# Patient Record
Sex: Female | Born: 1995 | Race: Black or African American | Hispanic: No | Marital: Single | Smoking: Never smoker
Health system: Southern US, Community
[De-identification: ages and names within clinical notes are randomized; demographics above are authoritative.]

## PROBLEM LIST (undated history)

## (undated) DIAGNOSIS — J45909 Unspecified asthma, uncomplicated: Secondary | ICD-10-CM

## (undated) HISTORY — DX: Unspecified asthma, uncomplicated: J45.909

---
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
## 2017-05-14 IMAGING — MR MR FOOT*L* W/O CM
4 of 6 series · 21 of 40 positions shown · non-contrast
Comparison: Plain films left foot 05/28/2016.

CLINICAL DATA: Lateral left foot pain for 3 months. No known
injury.

EXAM:
MRI OF THE LEFT FOOT WITHOUT CONTRAST
TECHNIQUE: Multiplanar, multisequence MR imaging of the left foot was
performed. No intravenous contrast was administered.

[Series 3: T2 fat-sat · coronal · 4.0mm · 0.23mm/px · 8 of 30 slices shown (1 of 3)]
[im 1/30]
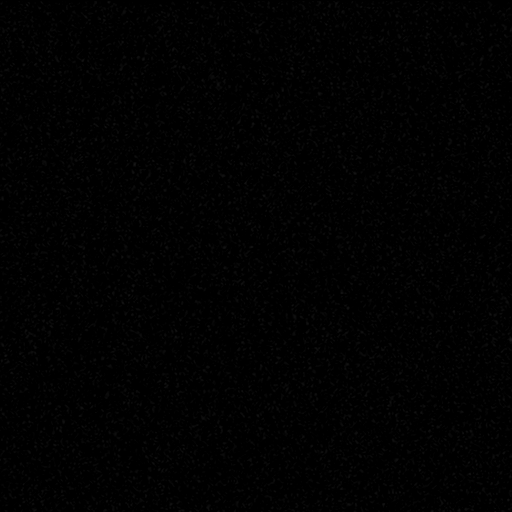
[im 5/30]
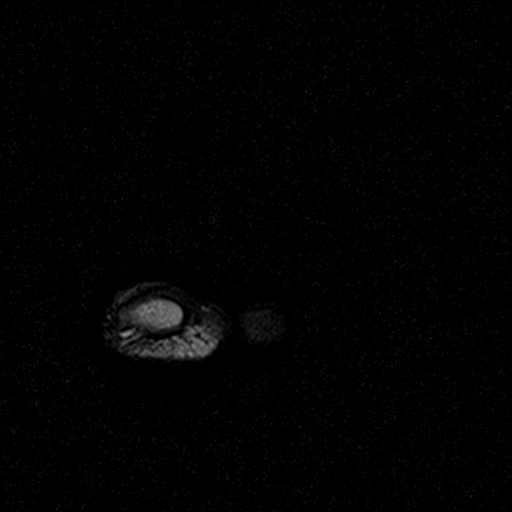
[im 9/30]
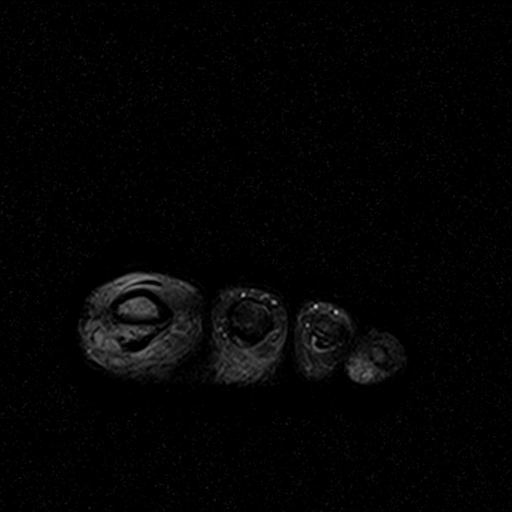
[im 13/30]
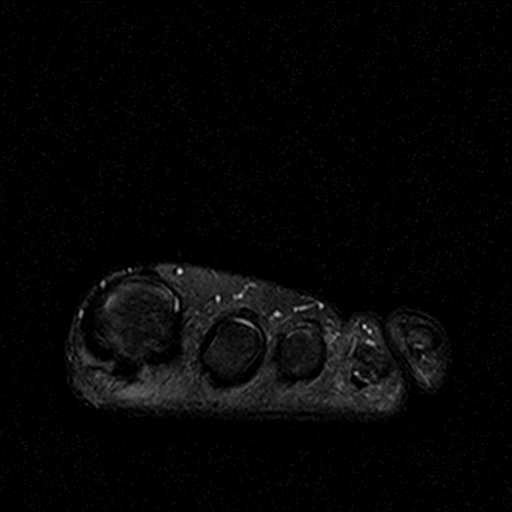
[im 17/30]
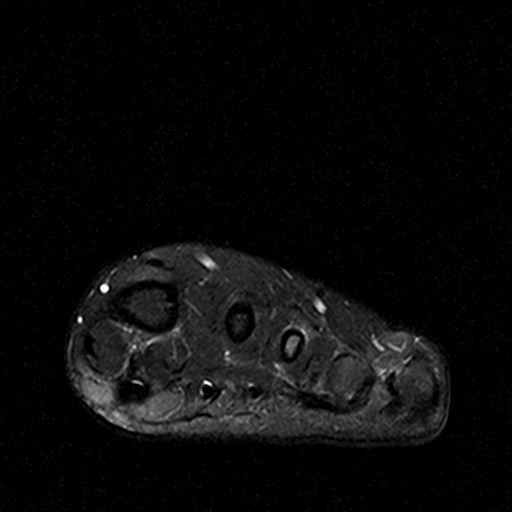
[im 21/30]
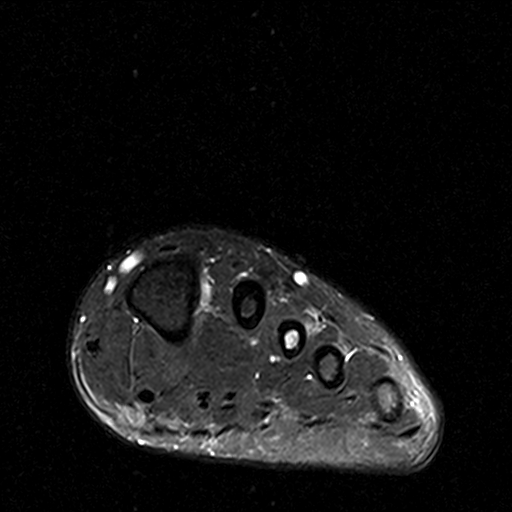
[im 25/30]
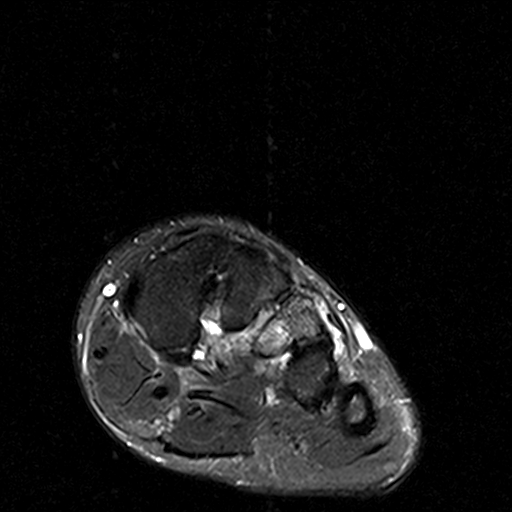
[im 30/30]
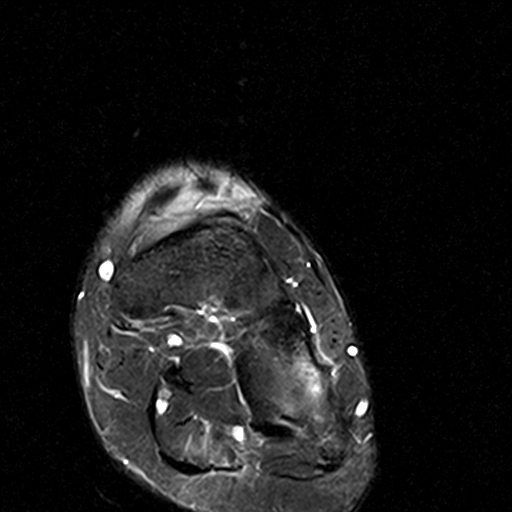

[Series 4: T1 · coronal · 4.0mm · 0.38mm/px · 3 of 30 slices shown]
[im 5/30]
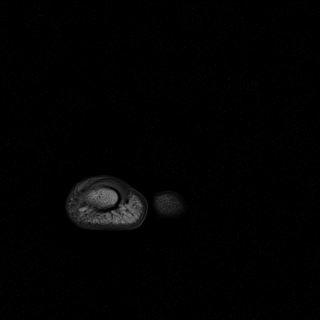
[im 17/30]
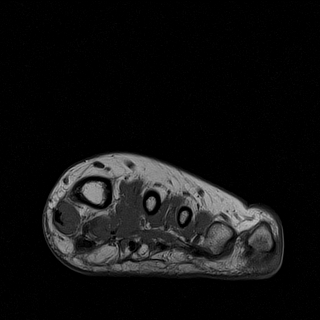
[im 25/30]
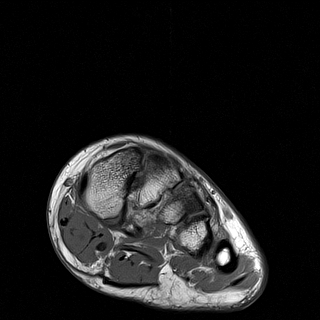

[Series 6: T2 fat-sat · axial · 2.5mm · 0.37mm/px · z∈[-102,-53]mm · 6 of 21 slices shown (2 of 3)]
[im 1/21]
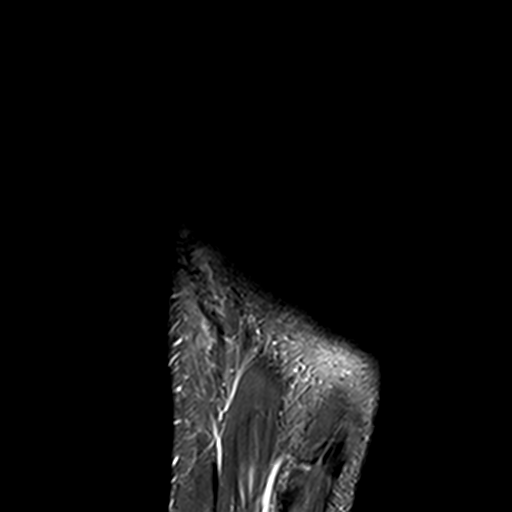
[im 5/21]
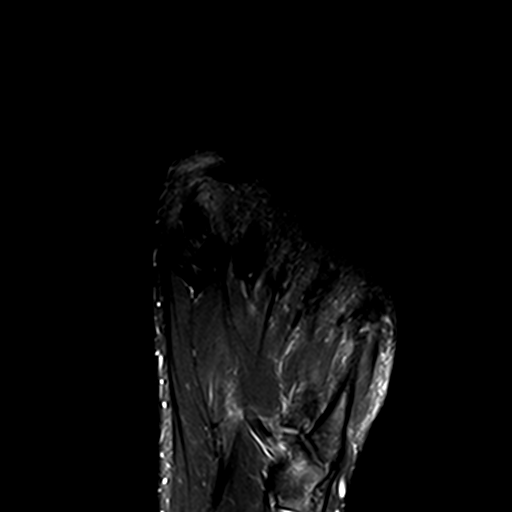
[im 9/21]
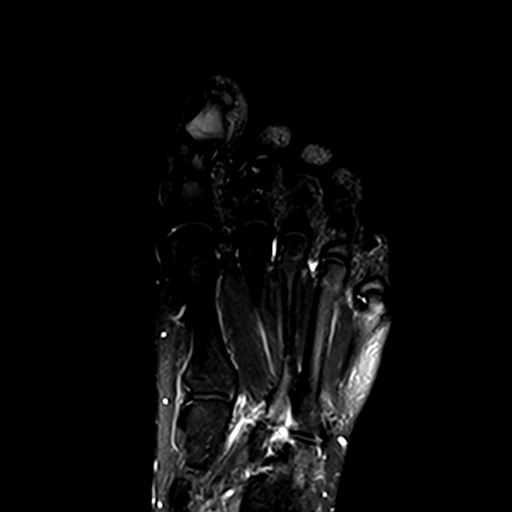
[im 13/21]
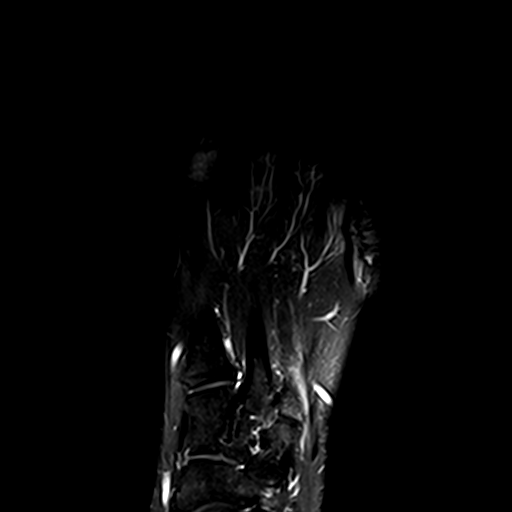
[im 17/21]
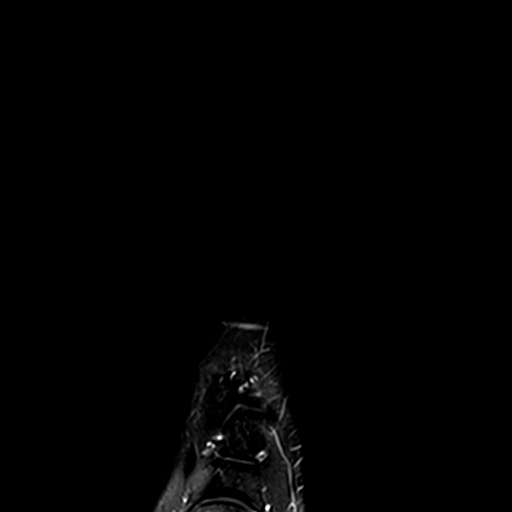
[im 21/21]
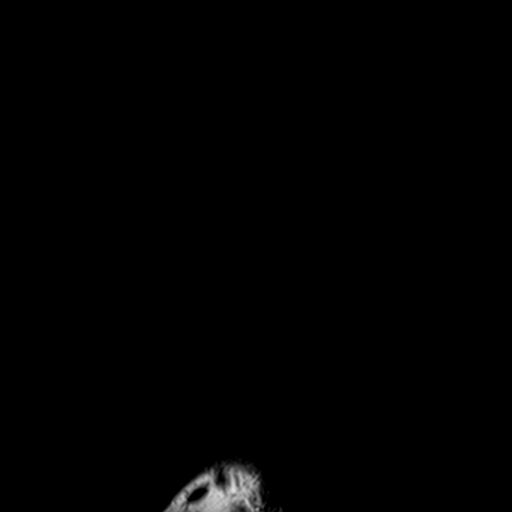

[Series 7: T2 fat-sat · sagittal · 3.0mm · 0.35mm/px · 4 of 25 slices shown (3 of 3)]
[im 1/25]
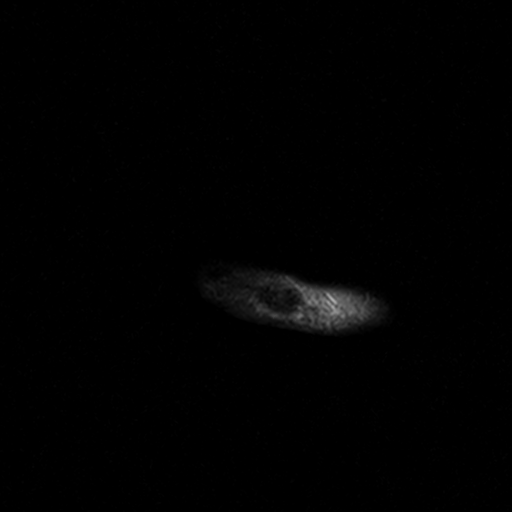
[im 5/25]
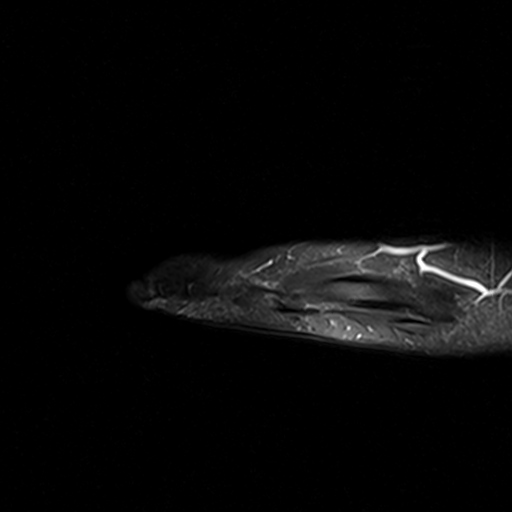
[im 13/25]
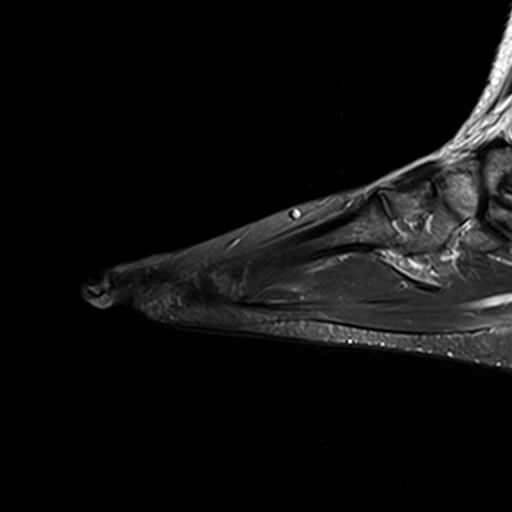
[im 21/25]
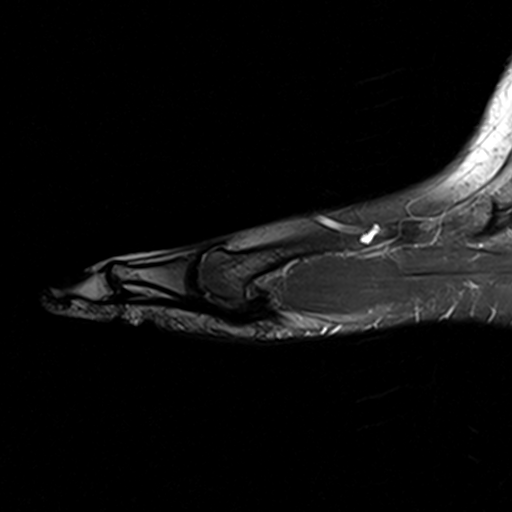

[21 of 40 positions shown; findings below may reference images not displayed]

FINDINGS: Bones/Joint/Cartilage

Intense marrow edema is seen in the base and throughout the
diaphysis of the third metatarsal. Milder degree of marrow edema is
seen throughout the fourth and fifth metatarsals and cuboid bone.
Rounded focus of decreased T1 and T2 signal in the lateral margin of
the base of the third metatarsal is consistent with sclerosis
possibly related to old injury. No fracture is identified.

Ligaments

Intact.

Muscles and Tendons

Intact and normal in appearance.

Soft tissues

Negative.
IMPRESSION: Findings consistent with stress reaction without fracture in the
cuboid and third through fifth metatarsals. Changes are worst and
third and fifth metatarsals. No fracture.

## 2018-03-17 ENCOUNTER — Ambulatory Visit: Payer: Self-pay | Admitting: Sports Medicine

## 2019-10-01 ENCOUNTER — Ambulatory Visit: Payer: 59 | Attending: Internal Medicine

## 2019-10-01 DIAGNOSIS — Z23 Encounter for immunization: Secondary | ICD-10-CM

## 2019-10-01 NOTE — Progress Notes (Signed)
   Covid-19 Vaccination Clinic  Name:  Brooke Parks    MRN: [id number] DOB: 24-Oct-1995  10/01/2019  [name] was observed post Covid-19 immunization for 15 minutes without incident. She was provided with Vaccine Information Sheet and instruction to access the V-Safe system.   [name] was instructed to call 911 with any severe reactions post vaccine: Marland Kitchen Difficulty breathing  . Swelling of face and throat  . A fast heartbeat  . A bad rash all over body  . Dizziness and weakness   Immunizations Administered    Name Date Dose VIS Date Route   Pfizer COVID-19 Vaccine 10/01/2019  1:04 PM 0.3 mL 05/29/2019 Intramuscular   Manufacturer: [employer], [employer]   Lot: [id number]   NDC: [address]-1

## 2019-10-26 ENCOUNTER — Ambulatory Visit: Payer: 59 | Attending: Internal Medicine

## 2019-10-26 DIAGNOSIS — Z23 Encounter for immunization: Secondary | ICD-10-CM

## 2019-10-26 NOTE — Progress Notes (Signed)
   Covid-19 Vaccination Clinic  Name:  Brooke Parks    MRN: [id number] DOB: Jun 09, 1996  10/26/2019  Brooke Parks was observed post Covid-19 immunization for 15 minutes without incident. She was provided with Vaccine Information Sheet and instruction to access the V-Safe system.   Brooke Parks was instructed to call 911 with any severe reactions post vaccine: Marland Kitchen Difficulty breathing  . Swelling of face and throat  . A fast heartbeat  . A bad rash all over body  . Dizziness and weakness   Immunizations Administered    Name Date Dose VIS Date Route   Pfizer COVID-19 Vaccine 10/26/2019 10:42 AM 0.3 mL 08/12/2018 Intramuscular   Manufacturer: [employer], [employer]   Lot: [id number]   NDC: [address]-8

## 2022-10-29 ENCOUNTER — Ambulatory Visit (INDEPENDENT_AMBULATORY_CARE_PROVIDER_SITE_OTHER): Payer: Managed Care, Other (non HMO) | Admitting: Family Medicine

## 2022-10-29 ENCOUNTER — Encounter: Payer: Self-pay | Admitting: Family Medicine

## 2022-10-29 VITALS — BP 129/81 | HR 71 | Temp 98.2°F | Resp 20 | Ht 68.0 in | Wt 247.0 lb

## 2022-10-29 DIAGNOSIS — Z7689 Persons encountering health services in other specified circumstances: Secondary | ICD-10-CM

## 2022-10-29 DIAGNOSIS — Z124 Encounter for screening for malignant neoplasm of cervix: Secondary | ICD-10-CM

## 2022-10-29 DIAGNOSIS — Z0000 Encounter for general adult medical examination without abnormal findings: Secondary | ICD-10-CM | POA: Diagnosis not present

## 2022-10-29 DIAGNOSIS — N898 Other specified noninflammatory disorders of vagina: Secondary | ICD-10-CM

## 2022-10-29 DIAGNOSIS — R7302 Impaired glucose tolerance (oral): Secondary | ICD-10-CM

## 2022-10-29 DIAGNOSIS — Z1159 Encounter for screening for other viral diseases: Secondary | ICD-10-CM

## 2022-10-29 DIAGNOSIS — Z13220 Encounter for screening for lipoid disorders: Secondary | ICD-10-CM

## 2022-10-29 NOTE — Progress Notes (Signed)
Complete physical exam and establish care  Patient: Brooke Parks   DOB: 11-May-1996   27 y.o. Female  MRN: [id number]  Subjective:    Chief Complaint  Patient presents with   Establish Care   Annual Exam    Brooke Parks is a 27 y.o. female who presents today for a complete physical exam. She reports consuming a general diet. The patient does not participate in regular exercise at present. She generally feels well. She reports sleeping well. She does have additional problems to discuss today.  She reports vaginal itching before her periods. No discharge. Haven't been sexually active since January 2024. Periods are regular, LMP 10/21/22.  Most recent fall risk assessment:    10/29/2022   11:16 AM  Fall Risk   Falls in the past year? 0  Number falls in past yr: 0  Injury with Fall? 0  Risk for fall due to : No Fall Risks  Follow up Falls evaluation completed     Most recent depression screenings:    10/29/2022   11:16 AM 07/16/2016    9:48 AM  PHQ 2/9 Scores  PHQ - 2 Score 1 0  PHQ- 9 Score 4   Exception Documentation  Other- indicate reason in comment box    Vision:Within last year  There are no problems to display for this patient.  Past Medical History:  Diagnosis Date   Asthma    No past surgical history on file. Social History   Tobacco Use   Smoking status: Never    Passive exposure: Never   Smokeless tobacco: Never  Vaping Use   Vaping Use: Never used  Substance Use Topics   Alcohol use: Never   Drug use: Never   Social History   Socioeconomic History   Marital status: Single    Spouse name: Not on file   Number of children: Not on file   Years of education: Not on file   Highest education level: Not on file  Occupational History   Not on file  Tobacco Use   Smoking status: Never    Passive exposure: Never   Smokeless tobacco: Never  Vaping Use   Vaping Use: Never used  Substance and Sexual Activity   Alcohol use: Never   Drug use: Never    Sexual activity: Yes    Birth control/protection: None  Other Topics Concern   Not on file  Social History Narrative   Not on file   Social Determinants of Health   Financial Resource Strain: Not on file  Food Insecurity: Not on file  Transportation Needs: Not on file  Physical Activity: Not on file  Stress: Not on file  Social Connections: Not on file  Intimate Partner Violence: Not on file   Family Status  Relation Name Status   Mother  Alive   Father  Deceased   Allergies  Allergen Reactions   Gramineae Pollens Hives   Other Hives      Patient Care Team: Suzan Slick, MD as PCP - General (Family Medicine)   Outpatient Medications Prior to Visit  Medication Sig Note   PROAIR HFA 108 (90 Base) MCG/ACT inhaler  (Patient not taking: Reported on 10/29/2022) 10/29/2022: Patient states that she does have inhaler that she uses      [DISCONTINUED] ranitidine (ZANTAC) 150 MG tablet Take 1 tablet (150 mg total) by mouth 2 (two) times daily.    No facility-administered medications prior to visit.    Review of  Systems  Genitourinary:        Vaginal itching before periods  All other systems reviewed and are negative.        Objective:     BP 129/81   Pulse 71   Temp 98.2 F (36.8 C) (Oral)   Resp 20   Ht 5\' 8"  (1.727 m)   Wt 247 lb (112 kg)   LMP 10/20/2022 (Exact Date)   SpO2 100%   BMI 37.56 kg/m  BP Readings from Last 3 Encounters:  10/29/22 129/81  07/16/16 (!) 112/55  06/27/16 120/70      Physical Exam Vitals and nursing note reviewed. Exam conducted with a chaperone present.  Constitutional:      Appearance: Normal appearance.     Comments: overweight  HENT:     Head: Normocephalic and atraumatic.     Right Ear: Tympanic membrane, ear canal and external ear normal.     Left Ear: Tympanic membrane, ear canal and external ear normal.     Nose: Nose normal.     Mouth/Throat:     Mouth: Mucous membranes are moist.     Pharynx:  Oropharynx is clear.  Eyes:     Extraocular Movements: Extraocular movements intact.     Conjunctiva/sclera: Conjunctivae normal.     Pupils: Pupils are equal, round, and reactive to light.  Cardiovascular:     Rate and Rhythm: Normal rate and regular rhythm.     Pulses: Normal pulses.     Heart sounds: Normal heart sounds.  Pulmonary:     Effort: Pulmonary effort is normal.     Breath sounds: Normal breath sounds.  Abdominal:     General: Abdomen is flat. Bowel sounds are normal.  Genitourinary:    General: Normal vulva.  Musculoskeletal:        General: Normal range of motion.  Skin:    General: Skin is warm.     Capillary Refill: Capillary refill takes less than 2 seconds.  Neurological:     General: No focal deficit present.     Mental Status: She is alert and oriented to person, place, and time. Mental status is at baseline.  Psychiatric:        Mood and Affect: Mood normal.        Behavior: Behavior normal.        Thought Content: Thought content normal.        Judgment: Judgment normal.     No results found for any visits on 10/29/22.      Assessment & Plan:    Routine Health Maintenance and Physical Exam  Immunization History  Administered Date(s) Administered   PFIZER(Purple Top)SARS-COV-2 Vaccination 10/01/2019, 10/26/2019   PPD Test 12/12/2020    Health Maintenance  Topic Date Due   HPV VACCINES (1 - 2-dose series) Never done   HIV Screening  Never done   Hepatitis C Screening  Never done   DTaP/Tdap/Td (1 - Tdap) Never done   PAP-Cervical Cytology Screening  Never done   PAP SMEAR-Modifier  Never done   COVID-19 Vaccine (3 - 2023-24 season) 02/16/2022   INFLUENZA VACCINE  01/17/2023    Discussed health benefits of physical activity, and encouraged her to engage in regular exercise appropriate for her age and condition.  No follow-ups on file. Annual physical exam  Encounter to establish care with new doctor  Impaired glucose tolerance -      CBC with Differential/Platelet -     Comprehensive metabolic panel -  Hemoglobin A1c  Encounter for lipid screening for cardiovascular disease -     Lipid panel  Screening for cervical cancer -     Pap IG and Chlamydia/Gonococcus, NAA  Screening for viral disease -     Hepatitis C antibody -     HIV Antibody (routine testing w rflx) -     Pap IG and Chlamydia/Gonococcus, NAA  Vaginal itching -     Urinalysis   Screening labs with std screening on pap swab Urinalysis due to vaginal itching. Follow up pending results or worsening symptoms, which tends to be only before periods.      Suzan Slick, MD

## 2022-10-30 LAB — LIPID PANEL
Chol/HDL Ratio: 4.4 ratio (ref 0.0–4.4)
Cholesterol, Total: 180 mg/dL (ref 100–199)
HDL: 41 mg/dL (ref >39)
LDL Chol Calc (NIH): 123 mg/dL — ABNORMAL HIGH (ref 0–99)
Triglycerides: 84 mg/dL (ref 0–149)
VLDL Cholesterol Cal: 16 mg/dL (ref 5–40)

## 2022-10-30 LAB — CBC WITH DIFFERENTIAL/PLATELET
Basophils Absolute: 0 10*3/uL (ref 0.0–0.2)
Basos: 1 %
EOS (ABSOLUTE): 0.2 10*3/uL (ref 0.0–0.4)
Eos: 3 %
Hematocrit: 39.4 % (ref 34.0–46.6)
Hemoglobin: 11.9 g/dL (ref 11.1–15.9)
Immature Grans (Abs): 0 10*3/uL (ref 0.0–0.1)
Immature Granulocytes: 0 %
Lymphocytes Absolute: 1.8 10*3/uL (ref 0.7–3.1)
Lymphs: 36 %
MCH: 23.2 pg — ABNORMAL LOW (ref 26.6–33.0)
MCHC: 30.2 g/dL — ABNORMAL LOW (ref 31.5–35.7)
MCV: 77 fL — ABNORMAL LOW (ref 79–97)
Monocytes Absolute: 0.4 10*3/uL (ref 0.1–0.9)
Monocytes: 8 %
Neutrophils Absolute: 2.6 10*3/uL (ref 1.4–7.0)
Neutrophils: 52 %
Platelets: 399 10*3/uL (ref 150–450)
RBC: 5.13 x10E6/uL (ref 3.77–5.28)
RDW: 14.5 % (ref 11.7–15.4)
WBC: 4.9 10*3/uL (ref 3.4–10.8)

## 2022-10-30 LAB — COMPREHENSIVE METABOLIC PANEL
ALT: 12 IU/L (ref 0–32)
AST: 13 IU/L (ref 0–40)
Albumin/Globulin Ratio: 1.5 (ref 1.2–2.2)
Albumin: 4.2 g/dL (ref 4.0–5.0)
Alkaline Phosphatase: 61 IU/L (ref 44–121)
BUN/Creatinine Ratio: 9 (ref 9–23)
BUN: 7 mg/dL (ref 6–20)
Bilirubin Total: <0.2 mg/dL (ref 0.0–1.2)
CO2: 19 mmol/L — ABNORMAL LOW (ref 20–29)
Calcium: 9.4 mg/dL (ref 8.7–10.2)
Chloride: 106 mmol/L (ref 96–106)
Creatinine, Ser: 0.76 mg/dL (ref 0.57–1.00)
Globulin, Total: 2.8 g/dL (ref 1.5–4.5)
Glucose: 88 mg/dL (ref 70–99)
Potassium: 4.2 mmol/L (ref 3.5–5.2)
Sodium: 139 mmol/L (ref 134–144)
Total Protein: 7 g/dL (ref 6.0–8.5)
eGFR: 111 mL/min/{1.73_m2} (ref >59)

## 2022-10-30 LAB — URINALYSIS
Bilirubin, UA: NEGATIVE
Glucose, UA: NEGATIVE
Ketones, UA: NEGATIVE
Leukocytes,UA: NEGATIVE
Nitrite, UA: NEGATIVE
Protein,UA: NEGATIVE
RBC, UA: NEGATIVE
Specific Gravity, UA: 1.025 (ref 1.005–1.030)
Urobilinogen, Ur: 0.2 mg/dL (ref 0.2–1.0)
pH, UA: 5.5 (ref 5.0–7.5)

## 2022-10-30 LAB — HIV ANTIBODY (ROUTINE TESTING W REFLEX): HIV Screen 4th Generation wRfx: NONREACTIVE

## 2022-10-30 LAB — HEPATITIS C ANTIBODY: Hep C Virus Ab: NONREACTIVE

## 2022-10-30 LAB — HEMOGLOBIN A1C
Est. average glucose Bld gHb Est-mCnc: 103 mg/dL
Hgb A1c MFr Bld: 5.2 % (ref 4.8–5.6)

## 2022-10-31 LAB — PAP IG AND CT-NG NAA
Chlamydia, Nuc. Acid Amp: NEGATIVE
Gonococcus by Nucleic Acid Amp: NEGATIVE
PAP Smear Comment: 0

## 2022-11-26 ENCOUNTER — Telehealth: Payer: Self-pay | Admitting: Family Medicine

## 2022-11-26 NOTE — Telephone Encounter (Signed)
error 

## 2023-01-24 ENCOUNTER — Encounter (INDEPENDENT_AMBULATORY_CARE_PROVIDER_SITE_OTHER): Payer: Managed Care, Other (non HMO) | Admitting: Family Medicine

## 2023-01-24 ENCOUNTER — Encounter: Payer: Self-pay | Admitting: Family Medicine

## 2023-01-24 ENCOUNTER — Ambulatory Visit: Payer: Managed Care, Other (non HMO) | Admitting: Family Medicine

## 2023-01-24 VITALS — BP 120/78 | HR 84 | Temp 98.2°F | Resp 18 | Ht 68.0 in | Wt 246.2 lb

## 2023-01-24 DIAGNOSIS — F418 Other specified anxiety disorders: Secondary | ICD-10-CM

## 2023-01-24 DIAGNOSIS — S39012A Strain of muscle, fascia and tendon of lower back, initial encounter: Secondary | ICD-10-CM

## 2023-01-24 MED ORDER — HYDROXYZINE HCL 10 MG PO TABS: 10.0000 mg | ORAL_TABLET | Freq: Three times a day (TID) | ORAL | 0 refills | Status: DC | PRN

## 2023-01-24 MED ORDER — METHYLPREDNISOLONE 4 MG PO TBPK: ORAL_TABLET | ORAL | 0 refills | Status: DC

## 2023-01-24 MED ORDER — BACLOFEN 10 MG PO TABS: 10.0000 mg | ORAL_TABLET | Freq: Three times a day (TID) | ORAL | 0 refills | Status: DC

## 2023-01-24 MED ORDER — ESCITALOPRAM OXALATE 5 MG PO TABS: 5.0000 mg | ORAL_TABLET | Freq: Every day | ORAL | 3 refills | Status: DC

## 2023-01-24 NOTE — Progress Notes (Signed)
   Acute Office Visit  Subjective:     Patient ID: Brooke Parks, female    DOB: 12-13-1995, 27 y.o.   MRN: [id number]  Chief Complaint  Patient presents with   Back Pain    Patient is here to establish care,she states that Saturday while changing her tire on her car, she felt as though she may have pulled something in her lower back , she states today on a pain scale her pain is a (6) Saturday when it happened her pain scale was a (10)    HPI Patient is in today for acute visit.  She reports Saturday, she was changing a tire and had back pain that day. She reports the pain was 10/10 that day and got worse on Sunday.  She has tried Tylenol 500 mg every 6 hours x 2 days. This didn't help her pain. Bending over makes the pain worse. Sitting relieves the pain. She reports she hurt her back before some years ago. She went to ER at that time and was given steroids and muscle relaxer.   ROS      Objective:    BP 120/78   Pulse 84   Temp 98.2 F (36.8 C) (Oral)   Resp 18   Ht 5\' 8"  (1.727 m)   Wt 246 lb 3.2 oz (111.7 kg)   LMP 01/19/2023 (Exact Date)   SpO2 97%   BMI 37.43 kg/m    Physical Exam  No results found for any visits on 01/24/23.      Assessment & Plan:   Problem List Items Addressed This Visit   None   No orders of the defined types were placed in this encounter.   No follow-ups on file.  Suzan Slick, MD

## 2023-01-24 NOTE — Progress Notes (Signed)
Established Patient Office Visit  Subjective   Patient ID: Brooke Parks, female    DOB: 1996/05/04  Age: 27 y.o. MRN: [id number]  Chief Complaint  Patient presents with   Back Pain     Patient is here to establish care,she states that Saturday while changing her tire on her car, she felt as though she may have pulled something in her lower back , she states today on a pain scale her pain is a (6) Saturday when it happened her pain scale was a (10)      HPI  Back pain She reports Saturday, she was changing a tire and had back pain that day. She reports the pain was 10/10 that day and got worse on Sunday. She has tried Tylenol 500 mg every 6 hours x 2 days. This didn't help her pain. Bending over makes the pain worse. Sitting relieves the pain. She reports she hurt her back before some years ago. She went to ER at that time and was given steroids and muscle relaxer. She reports this helped.   Depression/anxiety Pt also reports worsening anxiety and depression. She was prescribed Lexapro 5 mg and Hydroxyzine 10 mg TID. She reports she never took the Lexapro but only using the Hydroxyzine and this wasn't helping. She reports her mood and anxiety has worsened. Flowsheet Row Office Visit from 01/24/2023 in [facility]  PHQ-9 Total Score 16          01/24/2023    8:44 AM 10/29/2022   11:17 AM  GAD 7 : Generalized Anxiety Score  Nervous, Anxious, on Edge 3 0  Control/stop worrying 2 0  Worry too much - different things 2 0  Trouble relaxing 2 0  Restless 3 0  Easily annoyed or irritable 3 1  Afraid - awful might happen 1 0  Total GAD 7 Score 16 1  Anxiety Difficulty Somewhat difficult Not difficult at all     Review of Systems  Musculoskeletal:  Positive for back pain.  Psychiatric/Behavioral:  Positive for depression. The patient is nervous/anxious.   All other systems reviewed and are negative.    Objective:     LMP 01/19/2023 (Exact Date)     Physical Exam Vitals and nursing note reviewed.  Constitutional:      Appearance: Normal appearance. She is normal weight.  HENT:     Head: Normocephalic and atraumatic.     Right Ear: External ear normal.     Left Ear: External ear normal.     Nose: Nose normal.     Mouth/Throat:     Mouth: Mucous membranes are moist.     Pharynx: Oropharynx is clear.  Eyes:     Conjunctiva/sclera: Conjunctivae normal.     Pupils: Pupils are equal, round, and reactive to light.  Cardiovascular:     Rate and Rhythm: Normal rate and regular rhythm.     Pulses: Normal pulses.     Heart sounds: Normal heart sounds.  Pulmonary:     Effort: Pulmonary effort is normal.     Breath sounds: Normal breath sounds.  Abdominal:     General: Abdomen is flat.  Musculoskeletal:        General: Swelling and tenderness present. Normal range of motion.     Comments: Left paraspinal tenderness  Skin:    General: Skin is warm.     Capillary Refill: Capillary refill takes less than 2 seconds.  Neurological:     General:  No focal deficit present.     Mental Status: She is alert and oriented to person, place, and time. Mental status is at baseline.  Psychiatric:        Mood and Affect: Mood normal.        Behavior: Behavior normal.        Thought Content: Thought content normal.        Judgment: Judgment normal.    No results found for any visits on 01/24/23.    The ASCVD Risk score (Arnett DK, et al., 2019) failed to calculate for the following reasons:   The 2019 ASCVD risk score is only valid for ages 22 to 16    Assessment & Plan:   Problem List Items Addressed This Visit   None Strain of lumbar region, initial encounter -     methylPREDNISolone; 6-day pack as directed  Dispense: 21 tablet; Refill: 0 -     Baclofen; Take 1 tablet (10 mg total) by mouth 3 (three) times daily.  Dispense: 30 each; Refill: 0  Depression with anxiety -     Escitalopram Oxalate; Take 1 tablet (5 mg total) by  mouth daily.  Dispense: 30 tablet; Refill: 3 -     hydrOXYzine HCl; Take 1 tablet (10 mg total) by mouth 3 (three) times daily as needed.  Dispense: 30 tablet; Refill: 0   Back pain likely strain. Medrol dose pack and Baclofen 10mg  TID prn sent. Also advised to do stretches and muscle rubs. For worsening depression/anxiety, resend Lexapro 5mg  daily and Hydroxyzine 10mg  TID prn. Follow up in 4-6 weeks.    No follow-ups on file.    Suzan Slick, MD

## 2023-02-08 ENCOUNTER — Other Ambulatory Visit: Payer: Self-pay | Admitting: Family Medicine

## 2023-02-08 DIAGNOSIS — F418 Other specified anxiety disorders: Secondary | ICD-10-CM

## 2023-02-12 ENCOUNTER — Other Ambulatory Visit: Payer: Self-pay | Admitting: Family Medicine

## 2023-02-12 DIAGNOSIS — F418 Other specified anxiety disorders: Secondary | ICD-10-CM

## 2023-02-12 MED ORDER — HYDROXYZINE HCL 10 MG PO TABS: 10.0000 mg | ORAL_TABLET | Freq: Three times a day (TID) | ORAL | 0 refills | Status: DC | PRN

## 2023-03-11 ENCOUNTER — Other Ambulatory Visit: Payer: Self-pay | Admitting: Family Medicine

## 2023-03-11 DIAGNOSIS — F418 Other specified anxiety disorders: Secondary | ICD-10-CM

## 2023-03-13 MED ORDER — HYDROXYZINE HCL 10 MG PO TABS: 10.0000 mg | ORAL_TABLET | Freq: Three times a day (TID) | ORAL | 5 refills | Status: AC | PRN

## 2023-05-07 ENCOUNTER — Ambulatory Visit (INDEPENDENT_AMBULATORY_CARE_PROVIDER_SITE_OTHER): Payer: Self-pay

## 2023-05-07 ENCOUNTER — Encounter: Payer: Self-pay | Admitting: Family Medicine

## 2023-05-07 ENCOUNTER — Ambulatory Visit (INDEPENDENT_AMBULATORY_CARE_PROVIDER_SITE_OTHER): Payer: Self-pay | Admitting: Family Medicine

## 2023-05-07 VITALS — BP 122/81 | HR 72 | Temp 98.1°F | Resp 18 | Ht 68.0 in | Wt 256.4 lb

## 2023-05-07 DIAGNOSIS — S39012D Strain of muscle, fascia and tendon of lower back, subsequent encounter: Secondary | ICD-10-CM

## 2023-05-07 MED ORDER — METHYLPREDNISOLONE 4 MG PO TBPK: ORAL_TABLET | ORAL | 0 refills | Status: DC

## 2023-05-07 MED ORDER — BACLOFEN 10 MG PO TABS: 10.0000 mg | ORAL_TABLET | Freq: Three times a day (TID) | ORAL | 0 refills | Status: DC

## 2023-05-07 NOTE — Progress Notes (Signed)
Acute Office Visit  Subjective:     Patient ID: Brooke Parks, female    DOB: 10-Jan-1996, 27 y.o.   MRN: [id number]  Chief Complaint  Patient presents with   Back Pain    Patient states that she has been having muscle spasms in her lower to mid  back since Saturday, she states that when she woke up to use the bathroom that's when the pain started making it hard for her to walk, she states at one point she fell to the floor and could not get up , pain was so intense it caused nausea and vomiting    Back Pain  Patient is in today for acute visit.  Back pain She reports left sided back pain. She got out of bed on Saturday morning to use the restroom. When she tried to get up off the toilet, she started having sharp back pain. The whole weekend, it was painful. When she moves certain ways, the pain is worse. She says it's usually a throb. She says Saturday was the worse with any movement. She says it was hard to get up out of bed on Sunday morning. She says she has been taking Baclofen that she had left from initial visit and used heating pads. This has helped some.   Review of Systems  Musculoskeletal:  Positive for back pain.  All other systems reviewed and are negative.       Objective:    BP 122/81   Pulse 72   Temp 98.1 F (36.7 C) (Oral)   Resp 18   Ht 5\' 8"  (1.727 m)   Wt 256 lb 6.4 oz (116.3 kg)   SpO2 98%   BMI 38.99 kg/m    Physical Exam Vitals and nursing note reviewed.  Constitutional:      Appearance: Normal appearance. She is normal weight.  HENT:     Head: Normocephalic and atraumatic.     Right Ear: External ear normal.     Left Ear: External ear normal.     Nose: Nose normal.     Mouth/Throat:     Mouth: Mucous membranes are moist.  Eyes:     Extraocular Movements: Extraocular movements intact.  Cardiovascular:     Rate and Rhythm: Normal rate.  Pulmonary:     Effort: Pulmonary effort is normal.  Musculoskeletal:     Comments: Diminished ROM  of L spine in all directions.   Skin:    General: Skin is warm.     Capillary Refill: Capillary refill takes less than 2 seconds.  Neurological:     General: No focal deficit present.     Mental Status: She is alert and oriented to person, place, and time. Mental status is at baseline.  Psychiatric:        Mood and Affect: Mood normal.        Behavior: Behavior normal.        Thought Content: Thought content normal.        Judgment: Judgment normal.    No results found for any visits on 05/07/23.      Assessment & Plan:   Problem List Items Addressed This Visit   None Visit Diagnoses     Strain of lumbar region, subsequent encounter    -  Primary   Relevant Medications   methylPREDNISolone (MEDROL DOSEPAK) 4 MG TBPK tablet   baclofen (LIORESAL) 10 MG tablet   Other Relevant Orders   DG Lumbar Spine Complete  Strain of lumbar region, subsequent encounter -     DG Lumbar Spine Complete; Future -     methylPREDNISolone; 6-day pack as directed  Dispense: 21 tablet; Refill: 0 -     Baclofen; Take 1 tablet (10 mg total) by mouth 3 (three) times daily.  Dispense: 30 each; Refill: 0   Pt with recurrent and exacerbation of back pain since being seen in August. She was given Medrol dose pack and baclofen which worked. Will refill this and give her back exercises to do at home. Will send for xrays to rule out spinal abnormalities. To follow up on xray via mychart.  Meds ordered this encounter  Medications   methylPREDNISolone (MEDROL DOSEPAK) 4 MG TBPK tablet    Sig: 6-day pack as directed    Dispense:  21 tablet    Refill:  0   baclofen (LIORESAL) 10 MG tablet    Sig: Take 1 tablet (10 mg total) by mouth 3 (three) times daily.    Dispense:  30 each    Refill:  0    No follow-ups on file.  Suzan Slick, MD

## 2023-10-30 ENCOUNTER — Encounter: Payer: Managed Care, Other (non HMO) | Admitting: Family Medicine

## 2023-11-10 ENCOUNTER — Other Ambulatory Visit: Payer: Self-pay | Admitting: Family Medicine

## 2023-11-10 DIAGNOSIS — F418 Other specified anxiety disorders: Secondary | ICD-10-CM

## 2023-11-26 ENCOUNTER — Encounter: Admitting: Family Medicine

## 2023-12-12 ENCOUNTER — Encounter: Payer: Self-pay | Admitting: Family Medicine

## 2023-12-12 ENCOUNTER — Other Ambulatory Visit: Payer: Self-pay | Admitting: Family Medicine

## 2023-12-12 ENCOUNTER — Other Ambulatory Visit: Payer: Self-pay

## 2023-12-12 DIAGNOSIS — F418 Other specified anxiety disorders: Secondary | ICD-10-CM

## 2023-12-12 MED ORDER — ESCITALOPRAM OXALATE 5 MG PO TABS: 5.0000 mg | ORAL_TABLET | Freq: Every day | ORAL | 0 refills | Status: AC

## 2024-01-20 ENCOUNTER — Encounter: Payer: Self-pay | Admitting: Family Medicine

## 2024-01-20 ENCOUNTER — Ambulatory Visit (INDEPENDENT_AMBULATORY_CARE_PROVIDER_SITE_OTHER): Admitting: Family Medicine

## 2024-01-20 VITALS — BP 122/79 | HR 100 | Temp 98.1°F | Ht 68.0 in | Wt 267.0 lb

## 2024-01-20 DIAGNOSIS — R7302 Impaired glucose tolerance (oral): Secondary | ICD-10-CM

## 2024-01-20 DIAGNOSIS — G8929 Other chronic pain: Secondary | ICD-10-CM | POA: Diagnosis not present

## 2024-01-20 DIAGNOSIS — Z136 Encounter for screening for cardiovascular disorders: Secondary | ICD-10-CM

## 2024-01-20 DIAGNOSIS — R519 Headache, unspecified: Secondary | ICD-10-CM

## 2024-01-20 DIAGNOSIS — Z13220 Encounter for screening for lipoid disorders: Secondary | ICD-10-CM

## 2024-01-20 DIAGNOSIS — Z0000 Encounter for general adult medical examination without abnormal findings: Secondary | ICD-10-CM | POA: Diagnosis not present

## 2024-01-20 NOTE — Progress Notes (Signed)
 Complete physical exam and separate encounter  Patient: Brooke Parks   DOB: October 12, 1995   28 y.o. Female  MRN: [id number]  Subjective:    Chief Complaint  Patient presents with   Annual Exam    Brooke Parks is a 28 y.o. female who presents today for a complete physical exam. She reports consuming a general diet. basketball She generally feels well. She reports sleeping poorly. She does have additional problems to discuss today.    Most recent fall risk assessment:    01/20/2024    3:08 PM  Fall Risk   Falls in the past year? 0  Number falls in past yr: 0  Injury with Fall? 0  Risk for fall due to : No Fall Risks  Follow up Falls evaluation completed     Most recent depression screenings:    01/20/2024    3:16 PM 01/24/2023    8:44 AM  PHQ 2/9 Scores  PHQ - 2 Score 1 4  PHQ- 9 Score 9 16    Vision: more than a year  There are no active problems to display for this patient.  Past Medical History:  Diagnosis Date   Asthma    History reviewed. No pertinent surgical history. Social History   Socioeconomic History   Marital status: Single    Spouse name: Not on file   Number of children: Not on file   Years of education: Not on file   Highest education level: Bachelor's degree (e.g., BA, AB, BS)  Occupational History   Not on file  Tobacco Use   Smoking status: Never    Passive exposure: Never   Smokeless tobacco: Never  Vaping Use   Vaping status: Never Used  Substance and Sexual Activity   Alcohol use: Yes    Comment: socially   Drug use: Never   Sexual activity: Yes    Birth control/protection: None  Other Topics Concern   Not on file  Social History Narrative   Not on file   Social Drivers of Health   Financial Resource Strain: Low Risk  (01/19/2024)   Overall Financial Resource Strain (CARDIA)    Difficulty of Paying Living Expenses: Not very hard  Food Insecurity: No Food Insecurity (01/19/2024)   Hunger Vital Sign    Worried About Running Out  of Food in the Last Year: Never true    Ran Out of Food in the Last Year: Never true  Transportation Needs: No Transportation Needs (01/19/2024)   PRAPARE - Administrator, Civil Service (Medical): No    Lack of Transportation (Non-Medical): No  Physical Activity: Sufficiently Active (01/19/2024)   Exercise Vital Sign    Days of Exercise per Week: 3 days    Minutes of Exercise per Session: 60 min  Stress: Stress Concern Present (01/19/2024)   [employer] of Occupational Health - Occupational Stress Questionnaire    Feeling of Stress: To some extent  Social Connections: Moderately Isolated (01/19/2024)   Social Connection and Isolation Panel    Frequency of Communication with Friends and Family: More than three times a week    Frequency of Social Gatherings with Friends and Family: Once a week    Attends Religious Services: 1 to 4 times per year    Active Member of [employer] or Organizations: No    Attends Engineer, structural: Not on file    Marital Status: Never married  Intimate Partner Violence: Not on file   Family Status  Relation Name Status   Mother  Alive   Father  Deceased at age 46       Covid and complications  No partnership data on file   Allergies  Allergen Reactions   Gramineae Pollens Hives   Other Hives   Dust Mite Extract     Eyes water, congestion and ST   Mixed Ragweed     Congestion, watery eyes, ST      Patient Care Team: Colette Torrence GRADE, MD as PCP - General (Family Medicine)   Outpatient Medications Prior to Visit  Medication Sig   escitalopram  (LEXAPRO ) 5 MG tablet Take 1 tablet (5 mg total) by mouth daily.   hydrOXYzine  (ATARAX ) 10 MG tablet Take 1 tablet (10 mg total) by mouth 3 (three) times daily as needed.   baclofen  (LIORESAL ) 10 MG tablet Take 1 tablet (10 mg total) by mouth 3 (three) times daily. (Patient not taking: Reported on 01/20/2024)   methylPREDNISolone  (MEDROL  DOSEPAK) 4 MG TBPK tablet 6-day pack as directed  (Patient not taking: Reported on 01/20/2024)   No facility-administered medications prior to visit.    Review of Systems  All other systems reviewed and are negative.         Objective:     BP 122/79   Pulse 100   Temp 98.1 F (36.7 C)   Ht 5' 8 (1.727 m)   Wt 267 lb (121.1 kg)   SpO2 98%   BMI 40.60 kg/m  BP Readings from Last 3 Encounters:  01/20/24 122/79  05/07/23 122/81  01/24/23 120/78      Physical Exam Vitals and nursing note reviewed.  Constitutional:      Appearance: Normal appearance. She is normal weight.  HENT:     Head: Normocephalic and atraumatic.     Right Ear: Tympanic membrane, ear canal and external ear normal.     Left Ear: Tympanic membrane, ear canal and external ear normal.     Nose: Nose normal.     Mouth/Throat:     Mouth: Mucous membranes are moist.     Pharynx: Oropharynx is clear.  Eyes:     Conjunctiva/sclera: Conjunctivae normal.     Pupils: Pupils are equal, round, and reactive to light.  Cardiovascular:     Rate and Rhythm: Normal rate and regular rhythm.     Pulses: Normal pulses.     Heart sounds: Normal heart sounds.  Pulmonary:     Effort: Pulmonary effort is normal.     Breath sounds: Normal breath sounds.  Abdominal:     General: Abdomen is flat. Bowel sounds are normal.  Skin:    General: Skin is warm.     Capillary Refill: Capillary refill takes less than 2 seconds.  Neurological:     General: No focal deficit present.     Mental Status: She is alert and oriented to person, place, and time. Mental status is at baseline.  Psychiatric:        Mood and Affect: Mood normal.        Behavior: Behavior normal.        Thought Content: Thought content normal.        Judgment: Judgment normal.     No results found for any visits on 01/20/24. Last CBC Lab Results  Component Value Date   WBC 4.9 10/29/2022   HGB 11.9 10/29/2022   HCT 39.4 10/29/2022   MCV 77 (L) 10/29/2022   MCH 23.2 (L) 10/29/2022   RDW 14.5  10/29/2022   PLT 399 10/29/2022   Last metabolic panel Lab Results  Component Value Date   GLUCOSE 88 10/29/2022   NA 139 10/29/2022   K 4.2 10/29/2022   CL 106 10/29/2022   CO2 19 (L) 10/29/2022   BUN 7 10/29/2022   CREATININE 0.76 10/29/2022   EGFR 111 10/29/2022   CALCIUM 9.4 10/29/2022   PROT 7.0 10/29/2022   ALBUMIN 4.2 10/29/2022   LABGLOB 2.8 10/29/2022   AGRATIO 1.5 10/29/2022   BILITOT <0.2 10/29/2022   ALKPHOS 61 10/29/2022   AST 13 10/29/2022   ALT 12 10/29/2022   Last lipids Lab Results  Component Value Date   CHOL 180 10/29/2022   HDL 41 10/29/2022   LDLCALC 123 (H) 10/29/2022   TRIG 84 10/29/2022   CHOLHDL 4.4 10/29/2022   Last hemoglobin A1c Lab Results  Component Value Date   HGBA1C 5.2 10/29/2022        Assessment & Plan:    Routine Health Maintenance and Physical Exam  Immunization History  Administered Date(s) Administered   PFIZER(Purple Top)SARS-COV-2 Vaccination 10/01/2019, 10/26/2019   PPD Test 12/12/2020    Health Maintenance  Topic Date Due   INFLUENZA VACCINE  01/17/2024   COVID-19 Vaccine (3 - 2024-25 season) 02/05/2024 (Originally 02/17/2023)   DTaP/Tdap/Td (1 - Tdap) 01/19/2025 (Originally 07/25/2016)   Hepatitis B Vaccines (1 of 3 - 19+ 3-dose series) 01/19/2025 (Originally 07/25/2016)   HPV VACCINES (1 - 3-dose series) 01/19/2025 (Originally 07/25/2012)   Cervical Cancer Screening (Pap smear)  10/28/2025   Hepatitis C Screening  Completed   HIV Screening  Completed   Meningococcal B Vaccine  Aged Out    Discussed health benefits of physical activity, and encouraged her to engage in regular exercise appropriate for her age and condition.  Problem List Items Addressed This Visit   None  No follow-ups on file. Annual physical exam  Encounter for lipid screening for cardiovascular disease -     Lipid panel  Impaired glucose tolerance -     CBC with Differential/Platelet -     Comprehensive metabolic panel with GFR -      Hemoglobin A1c  Screening CPE labs See in 1 year sooner prn        Torrence CINDERELLA Barrier, MD

## 2024-01-20 NOTE — Progress Notes (Signed)
 Established Patient Office Visit  Subjective   Patient ID: Brooke Parks, female    DOB: 1995-07-17  Age: 28 y.o. MRN: [id number]  Chief Complaint  Patient presents with   Annual Exam    HPI  Headaches Pt reports 2-3 days a week she comes home after work with a headache. She says she works from [employer] and go home shower, then she's in the bed the whole day. Even days she doesn't have headaches, she says she still goes to bed. She does get up at 1-3am and unable to fall back to sleep. She also reports eating fast food. Her last eye exam was more than a year ago.   Review of Systems  Neurological:  Positive for headaches.  All other systems reviewed and are negative.     Objective:     BP 122/79   Pulse 100   Temp 98.1 F (36.7 C)   Ht 5' 8 (1.727 m)   Wt 267 lb (121.1 kg)   SpO2 98%   BMI 40.60 kg/m  BP Readings from Last 3 Encounters:  01/20/24 122/79  05/07/23 122/81  01/24/23 120/78      Physical Exam Vitals and nursing note reviewed.  Constitutional:      Appearance: Normal appearance. She is normal weight.  HENT:     Head: Normocephalic and atraumatic.     Right Ear: External ear normal.     Left Ear: External ear normal.     Nose: Nose normal.     Mouth/Throat:     Mouth: Mucous membranes are moist.     Pharynx: Oropharynx is clear.  Eyes:     Conjunctiva/sclera: Conjunctivae normal.     Pupils: Pupils are equal, round, and reactive to light.  Cardiovascular:     Rate and Rhythm: Normal rate and regular rhythm.     Pulses: Normal pulses.     Heart sounds: Normal heart sounds.  Pulmonary:     Effort: Pulmonary effort is normal.     Breath sounds: Normal breath sounds.  Skin:    General: Skin is warm.     Capillary Refill: Capillary refill takes less than 2 seconds.  Neurological:     General: No focal deficit present.     Mental Status: She is alert and oriented to person, place, and time. Mental status is at baseline.  Psychiatric:         Mood and Affect: Mood normal.        Behavior: Behavior normal.        Thought Content: Thought content normal.        Judgment: Judgment normal.     No results found for any visits on 01/20/24.     The ASCVD Risk score (Arnett DK, et al., 2019) failed to calculate for the following reasons:   The 2019 ASCVD risk score is only valid for ages 21 to 22    Assessment & Plan:    Chronic nonintractable headache, unspecified headache type   Pt with headaches. Advised to watch diet and cut back on fast foods, get eye exam, and defer sleep until 8-9pm so she's not getting up in the middle of the night unable to fall asleep. She will also keep a headache diary. She does report being on the screen a lot at work and could be eye strain as cause of headaches vs sleep disturbances? Unsure if she snores.  No follow-ups on file.    Torrence CINDERELLA Barrier, MD

## 2024-01-21 ENCOUNTER — Ambulatory Visit: Payer: Self-pay | Admitting: Family Medicine

## 2024-01-21 LAB — COMPREHENSIVE METABOLIC PANEL WITH GFR
ALT: 12 IU/L (ref 0–32)
AST: 13 IU/L (ref 0–40)
Albumin: 4.5 g/dL (ref 4.0–5.0)
Alkaline Phosphatase: 70 IU/L (ref 44–121)
BUN/Creatinine Ratio: 11 (ref 9–23)
BUN: 8 mg/dL (ref 6–20)
Bilirubin Total: 0.3 mg/dL (ref 0.0–1.2)
CO2: 19 mmol/L — ABNORMAL LOW (ref 20–29)
Calcium: 9.8 mg/dL (ref 8.7–10.2)
Chloride: 104 mmol/L (ref 96–106)
Creatinine, Ser: 0.75 mg/dL (ref 0.57–1.00)
Globulin, Total: 2.9 g/dL (ref 1.5–4.5)
Glucose: 87 mg/dL (ref 70–99)
Potassium: 4.2 mmol/L (ref 3.5–5.2)
Sodium: 137 mmol/L (ref 134–144)
Total Protein: 7.4 g/dL (ref 6.0–8.5)
eGFR: 113 mL/min/1.73 (ref >59)

## 2024-01-21 LAB — CBC WITH DIFFERENTIAL/PLATELET
Basophils Absolute: 0.1 x10E3/uL (ref 0.0–0.2)
Basos: 1 %
EOS (ABSOLUTE): 0.2 x10E3/uL (ref 0.0–0.4)
Eos: 2 %
Hematocrit: 37.3 % (ref 34.0–46.6)
Hemoglobin: 11.1 g/dL (ref 11.1–15.9)
Immature Grans (Abs): 0.1 x10E3/uL (ref 0.0–0.1)
Immature Granulocytes: 1 %
Lymphocytes Absolute: 2.3 x10E3/uL (ref 0.7–3.1)
Lymphs: 22 %
MCH: 23.5 pg — ABNORMAL LOW (ref 26.6–33.0)
MCHC: 29.8 g/dL — ABNORMAL LOW (ref 31.5–35.7)
MCV: 79 fL (ref 79–97)
Monocytes Absolute: 0.7 x10E3/uL (ref 0.1–0.9)
Monocytes: 6 %
Neutrophils Absolute: 7.5 x10E3/uL — ABNORMAL HIGH (ref 1.4–7.0)
Neutrophils: 68 %
Platelets: 452 x10E3/uL — ABNORMAL HIGH (ref 150–450)
RBC: 4.73 x10E6/uL (ref 3.77–5.28)
RDW: 12.9 % (ref 11.7–15.4)
WBC: 10.8 x10E3/uL (ref 3.4–10.8)

## 2024-01-21 LAB — LIPID PANEL
Chol/HDL Ratio: 4.9 ratio — ABNORMAL HIGH (ref 0.0–4.4)
Cholesterol, Total: 193 mg/dL (ref 100–199)
HDL: 39 mg/dL — ABNORMAL LOW (ref >39)
LDL Chol Calc (NIH): 135 mg/dL — ABNORMAL HIGH (ref 0–99)
Triglycerides: 105 mg/dL (ref 0–149)
VLDL Cholesterol Cal: 19 mg/dL (ref 5–40)

## 2024-01-21 LAB — HEMOGLOBIN A1C
Est. average glucose Bld gHb Est-mCnc: 108 mg/dL
Hgb A1c MFr Bld: 5.4 % (ref 4.8–5.6)

## 2024-07-07 ENCOUNTER — Ambulatory Visit: Admitting: Family Medicine

## 2024-07-22 ENCOUNTER — Telehealth: Admitting: Family Medicine

## 2024-07-22 ENCOUNTER — Encounter: Payer: Self-pay | Admitting: Family Medicine

## 2024-07-22 DIAGNOSIS — G8929 Other chronic pain: Secondary | ICD-10-CM

## 2024-07-22 DIAGNOSIS — F418 Other specified anxiety disorders: Secondary | ICD-10-CM

## 2024-07-22 DIAGNOSIS — R519 Headache, unspecified: Secondary | ICD-10-CM

## 2024-07-22 NOTE — Progress Notes (Signed)
 Virtual Visit via Video Note  I connected with Brooke Parks on 07/22/24 at  1:30 PM EST by a video enabled telemedicine application and verified that I am speaking with the correct person using two identifiers.  Location: Patient: Home in [place] Provider: Home in [place] (weather)   I discussed the limitations of evaluation and management by telemedicine and the availability of in person appointments. The patient expressed understanding and agreed to proceed.  History of Present Illness: Headaches Pt reports they aren't as consistent. She hasn't had as many. She hasn't had her eye exam yet.   She has less anxiety now that they are out of work due to the snow days. She is on Lexapro  5mg  daily and says this has helped her. She does admit to not taking daily. She does want to stay on it. She has plenty of refills. She says it helps with her mood as well.    Observations/Objective: Physical Exam Constitutional:      Appearance: She is well-developed and normal weight.  HENT:     Head: Normocephalic and atraumatic.     Right Ear: External ear normal.     Left Ear: External ear normal.  Eyes:     Extraocular Movements: Extraocular movements intact.  Pulmonary:     Effort: Pulmonary effort is normal.  Neurological:     General: No focal deficit present.     Mental Status: She is alert and oriented to person, place, and time. Mental status is at baseline.  Psychiatric:        Mood and Affect: Mood normal.        Behavior: Behavior normal.        Thought Content: Thought content normal.        Judgment: Judgment normal.      Assessment and Plan: Depression with anxiety  Chronic nonintractable headache, unspecified headache type     Follow Up Instructions:   pt with chronic headaches. Not as many. Advised to get eye exam soon. Pt stable on Lexapro . I've discussed with her that daily usage of the medicine is the only way to get maximum benefit of the medicine. She voiced understanding.  She says she's taken 2 thus far in the last week. She reports benefit from the medicine. Advised her to take daily if she's going to stay on the medicine.  To follow up in 6 months sooner prn CPE  I discussed the assessment and treatment plan with the patient. The patient was provided an opportunity to ask questions and all were answered. The patient agreed with the plan and demonstrated an understanding of the instructions.   The patient was advised to call back or seek an in-person evaluation if the symptoms worsen or if the condition fails to improve as anticipated.  I provided 5 minutes of non-face-to-face time during this encounter.   Torrence CINDERELLA Barrier, MD
